# Patient Record
Sex: Male | Born: 1990 | Race: Black or African American | Hispanic: No | Marital: Married | Smoking: Never smoker
Health system: Southern US, Community
[De-identification: ages and names within clinical notes are randomized; demographics above are authoritative.]

## PROBLEM LIST (undated history)

## (undated) DIAGNOSIS — I10 Essential (primary) hypertension: Secondary | ICD-10-CM

---

## 2019-04-04 ENCOUNTER — Ambulatory Visit (INDEPENDENT_AMBULATORY_CARE_PROVIDER_SITE_OTHER): Payer: Self-pay

## 2019-04-04 ENCOUNTER — Other Ambulatory Visit: Payer: Self-pay

## 2019-04-04 ENCOUNTER — Ambulatory Visit (HOSPITAL_COMMUNITY)
Admission: EM | Admit: 2019-04-04 | Discharge: 2019-04-04 | Disposition: A | Discharge status: Home / Self Care | Payer: Self-pay | Attending: Family Medicine | Admitting: Family Medicine

## 2019-04-04 DIAGNOSIS — W228XXA Striking against or struck by other objects, initial encounter: Secondary | ICD-10-CM

## 2019-04-04 DIAGNOSIS — S62346A Nondisplaced fracture of base of fifth metacarpal bone, right hand, initial encounter for closed fracture: Secondary | ICD-10-CM

## 2019-04-04 MED ORDER — OXYCODONE HCL 5 MG PO CAPS: 5.0000 mg | ORAL_CAPSULE | Freq: Four times a day (QID) | ORAL | 0 refills | Status: AC | PRN

## 2019-04-04 NOTE — Discharge Instructions (Signed)
Ice/cold pack over area for 10-15 min twice daily.  OK to take Tylenol 1000 mg (2 extra strength tabs) or 975 mg (3 regular strength tabs) every 6 hours as needed.  Ibuprofen 400-600 mg (2-3 over the counter strength tabs) every 6 hours as needed for pain.  Do not drink alcohol, do any illicit/street drugs, drive or do anything that requires alertness while on this oxycodone.
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
## 2019-04-04 NOTE — ED Provider Notes (Signed)
  [facility]    CSN: [id number] Arrival date & time: 04/04/19  1749     History    Musculoskeletal Exam  Patient: Keith Pruitt DOB: November 01, 1991  DOS: 04/04/2019  SUBJECTIVE:  Chief Complaint:   Chief Complaint  Patient presents with  . Hand Injury    Keith Pruitt is a 28 y.o.  male for evaluation and treatment of R hand pain.   Onset:  1 hr ago. Fixing cabinets, pulled a knob and hand slipped and "punched" fridge.  Location: R hand Character:  aching and throbbing  Progression of issue:  is unchanged Associated symptoms: swelling Treatment: to date has been none.   Neurovascular symptoms: no  ROS: Musculoskeletal/Extremities: +R hand pain Neuro: No numbness or tingling  No known health problems  Objective: VITAL SIGNS: BP (!) 192/123 (BP Location: Right Arm)   Pulse 92   Temp 98 F (36.7 C) (Oral)   Resp 16   SpO2 98%  Constitutional: Well formed, well developed. No acute distress. Cardiovascular: Brisk cap refill Thorax & Lungs: No accessory muscle use Musculoskeletal: R hand.   ROM not severely affected Tenderness to palpation: yes, over lateral dorsum of R hand, base Deformity: gross edema noted on dorsum of hand Ecchymosis: yes on palmar side Neurologic: Normal sensory function. No focal deficits noted.  Psychiatric: Normal mood. Age appropriate judgment and insight. Alert & oriented x 3.     Final Clinical Impressions(s) / UC Diagnoses   Final diagnoses:  Closed nondisplaced fracture of base of fifth metacarpal bone of right hand, initial encounter   I applied an ulnar gutter splint to the RUE. Wrapped with ACE bandage. Refer to ortho. Short course of oxy for pain. Warnings for this given. Ice. Tylenol, NSAIDs.    Discharge Instructions     Ice/cold pack over area for 10-15 min twice daily.  OK to take Tylenol 1000 mg (2 extra strength tabs) or 975 mg (3 regular strength tabs) every 6 hours as needed.  Ibuprofen 400-600 mg (2-3  over the counter strength tabs) every 6 hours as needed for pain.  Do not drink alcohol, do any illicit/street drugs, drive or do anything that requires alertness while on this oxycodone.      ED Prescriptions    Medication Sig Dispense Auth. Provider   oxycodone (OXY-IR) 5 MG capsule Take 1 capsule (5 mg total) by mouth every 6 (six) hours as needed for pain. 10 capsule Sharlene Dory, DO     Controlled Substance Prescriptions  Controlled Substance Registry consulted? Not Applicable   Sharlene Dory, [place] 04/04/19 1191

## 2019-06-04 ENCOUNTER — Inpatient Hospital Stay (HOSPITAL_COMMUNITY): Payer: No Typology Code available for payment source

## 2019-06-04 ENCOUNTER — Inpatient Hospital Stay (HOSPITAL_COMMUNITY): Payer: No Typology Code available for payment source | Admitting: Certified Registered Nurse Anesthetist

## 2019-06-04 ENCOUNTER — Emergency Department (HOSPITAL_COMMUNITY): Payer: No Typology Code available for payment source

## 2019-06-04 ENCOUNTER — Inpatient Hospital Stay (HOSPITAL_COMMUNITY)
Admission: EM | Admit: 2019-06-04 | Discharge: 2019-06-07 | DRG: 493 | Disposition: A | Discharge status: Home Health | Payer: No Typology Code available for payment source | Attending: Orthopedic Surgery | Admitting: Orthopedic Surgery

## 2019-06-04 ENCOUNTER — Encounter (HOSPITAL_COMMUNITY)
Admission: EM | Disposition: A | Discharge status: Home Health | Payer: Self-pay | Source: Home / Self Care | Attending: Orthopedic Surgery

## 2019-06-04 ENCOUNTER — Encounter (HOSPITAL_COMMUNITY): Payer: Self-pay | Admitting: *Deleted

## 2019-06-04 ENCOUNTER — Other Ambulatory Visit: Payer: Self-pay

## 2019-06-04 DIAGNOSIS — S81832A Puncture wound without foreign body, left lower leg, initial encounter: Secondary | ICD-10-CM

## 2019-06-04 DIAGNOSIS — W3400XA Accidental discharge from unspecified firearms or gun, initial encounter: Secondary | ICD-10-CM

## 2019-06-04 DIAGNOSIS — Z419 Encounter for procedure for purposes other than remedying health state, unspecified: Secondary | ICD-10-CM

## 2019-06-04 DIAGNOSIS — S82392B Other fracture of lower end of left tibia, initial encounter for open fracture type I or II: Secondary | ICD-10-CM | POA: Diagnosis present

## 2019-06-04 DIAGNOSIS — S82832B Other fracture of upper and lower end of left fibula, initial encounter for open fracture type I or II: Principal | ICD-10-CM | POA: Diagnosis present

## 2019-06-04 DIAGNOSIS — S71132A Puncture wound without foreign body, left thigh, initial encounter: Secondary | ICD-10-CM | POA: Diagnosis present

## 2019-06-04 DIAGNOSIS — S99912A Unspecified injury of left ankle, initial encounter: Secondary | ICD-10-CM

## 2019-06-04 DIAGNOSIS — Z1159 Encounter for screening for other viral diseases: Secondary | ICD-10-CM

## 2019-06-04 DIAGNOSIS — I10 Essential (primary) hypertension: Secondary | ICD-10-CM | POA: Diagnosis present

## 2019-06-04 DIAGNOSIS — E876 Hypokalemia: Secondary | ICD-10-CM

## 2019-06-04 DIAGNOSIS — S82302B Unspecified fracture of lower end of left tibia, initial encounter for open fracture type I or II: Secondary | ICD-10-CM | POA: Diagnosis present

## 2019-06-04 HISTORY — PX: IM NAILING TIBIA: SUR734

## 2019-06-04 HISTORY — DX: Essential (primary) hypertension: I10

## 2019-06-04 HISTORY — PX: TIBIA IM NAIL INSERTION: SHX2516

## 2019-06-04 LAB — CBC
HCT: 39.1 % (ref 39.0–52.0)
Hemoglobin: 12.2 g/dL — ABNORMAL LOW (ref 13.0–17.0)
MCH: 25.8 pg — ABNORMAL LOW (ref 26.0–34.0)
MCHC: 31.2 g/dL (ref 30.0–36.0)
MCV: 82.7 fL (ref 80.0–100.0)
Platelets: 289 10*3/uL (ref 150–400)
RBC: 4.73 MIL/uL (ref 4.22–5.81)
RDW: 13.6 % (ref 11.5–15.5)
WBC: 11.6 10*3/uL — ABNORMAL HIGH (ref 4.0–10.5)
nRBC: 0 % (ref 0.0–0.2)

## 2019-06-04 LAB — URINALYSIS, ROUTINE W REFLEX MICROSCOPIC
Bilirubin Urine: NEGATIVE
Glucose, UA: NEGATIVE mg/dL
Ketones, ur: NEGATIVE mg/dL
Leukocytes,Ua: NEGATIVE
Nitrite: NEGATIVE
Protein, ur: 30 mg/dL — AB
Specific Gravity, Urine: 1.029 (ref 1.005–1.030)
pH: 6 (ref 5.0–8.0)

## 2019-06-04 LAB — I-STAT CHEM 8, ED
BUN: 19 mg/dL (ref 6–20)
Calcium, Ion: 1.16 mmol/L (ref 1.15–1.40)
Chloride: 103 mmol/L (ref 98–111)
Creatinine, Ser: 1.4 mg/dL — ABNORMAL HIGH (ref 0.61–1.24)
Glucose, Bld: 148 mg/dL — ABNORMAL HIGH (ref 70–99)
HCT: 39 % (ref 39.0–52.0)
Hemoglobin: 13.3 g/dL (ref 13.0–17.0)
Potassium: 2.8 mmol/L — ABNORMAL LOW (ref 3.5–5.1)
Sodium: 141 mmol/L (ref 135–145)
TCO2: 26 mmol/L (ref 22–32)

## 2019-06-04 LAB — SAMPLE TO BLOOD BANK

## 2019-06-04 LAB — COMPREHENSIVE METABOLIC PANEL
ALT: 19 U/L (ref 0–44)
AST: 23 U/L (ref 15–41)
Albumin: 3.9 g/dL (ref 3.5–5.0)
Alkaline Phosphatase: 66 U/L (ref 38–126)
Anion gap: 12 (ref 5–15)
BUN: 17 mg/dL (ref 6–20)
CO2: 24 mmol/L (ref 22–32)
Calcium: 9.2 mg/dL (ref 8.9–10.3)
Chloride: 105 mmol/L (ref 98–111)
Creatinine, Ser: 1.47 mg/dL — ABNORMAL HIGH (ref 0.61–1.24)
GFR calc Af Amer: >60 mL/min (ref >60)
GFR calc non Af Amer: >60 mL/min (ref >60)
Glucose, Bld: 154 mg/dL — ABNORMAL HIGH (ref 70–99)
Potassium: 2.8 mmol/L — ABNORMAL LOW (ref 3.5–5.1)
Sodium: 141 mmol/L (ref 135–145)
Total Bilirubin: 0.5 mg/dL (ref 0.3–1.2)
Total Protein: 7.3 g/dL (ref 6.5–8.1)

## 2019-06-04 LAB — PROTIME-INR
INR: 1 (ref 0.8–1.2)
Prothrombin Time: 13.3 seconds (ref 11.4–15.2)

## 2019-06-04 LAB — MRSA PCR SCREENING: MRSA by PCR: NEGATIVE

## 2019-06-04 LAB — CDS SEROLOGY

## 2019-06-04 LAB — ETHANOL: Alcohol, Ethyl (B): <10 mg/dL (ref <10)

## 2019-06-04 LAB — SARS CORONAVIRUS 2 BY RT PCR (HOSPITAL ORDER, PERFORMED IN ~~LOC~~ HOSPITAL LAB): SARS Coronavirus 2: NEGATIVE

## 2019-06-04 LAB — LACTIC ACID, PLASMA: Lactic Acid, Venous: 2.2 mmol/L (ref 0.5–1.9)

## 2019-06-04 SURGERY — INSERTION, INTRAMEDULLARY ROD, TIBIA
Anesthesia: General | Site: Leg Lower | Laterality: Left

## 2019-06-04 MED ORDER — POTASSIUM CHLORIDE 2 MEQ/ML IV SOLN
INTRAVENOUS | Status: DC
  Administered 2019-06-04 – 2019-06-05 (×2): via INTRAVENOUS
  Filled 2019-06-04 (×4): qty 1000

## 2019-06-04 MED ORDER — SODIUM CHLORIDE 0.9 % IR SOLN
Status: DC | PRN
  Administered 2019-06-04: 3000 mL

## 2019-06-04 MED ORDER — MIDAZOLAM HCL 2 MG/2ML IJ SOLN
INTRAMUSCULAR | Status: AC
  Filled 2019-06-04: qty 2

## 2019-06-04 MED ORDER — METOCLOPRAMIDE HCL 5 MG PO TABS: 5.0000 mg | ORAL_TABLET | Freq: Three times a day (TID) | ORAL | Status: DC | PRN

## 2019-06-04 MED ORDER — LIDOCAINE 2% (20 MG/ML) 5 ML SYRINGE
INTRAMUSCULAR | Status: DC | PRN
  Administered 2019-06-04: 60 mg via INTRAVENOUS

## 2019-06-04 MED ORDER — ONDANSETRON HCL 4 MG/2ML IJ SOLN: 4.0000 mg | Freq: Once | INTRAMUSCULAR | Status: DC | PRN

## 2019-06-04 MED ORDER — STERILE WATER FOR IRRIGATION IR SOLN
Status: DC | PRN
  Administered 2019-06-04: 500 mL

## 2019-06-04 MED ORDER — OXYCODONE HCL 5 MG/5ML PO SOLN: 5.0000 mg | Freq: Once | ORAL | Status: DC | PRN

## 2019-06-04 MED ORDER — ASPIRIN EC 81 MG PO TBEC: 81.0000 mg | DELAYED_RELEASE_TABLET | Freq: Two times a day (BID) | ORAL | 0 refills | Status: AC

## 2019-06-04 MED ORDER — DOCUSATE SODIUM 100 MG PO CAPS: 100.0000 mg | ORAL_CAPSULE | Freq: Two times a day (BID) | ORAL | 0 refills | Status: AC

## 2019-06-04 MED ORDER — DEXMEDETOMIDINE HCL IN NACL 200 MCG/50ML IV SOLN
INTRAVENOUS | Status: AC
  Filled 2019-06-04: qty 50

## 2019-06-04 MED ORDER — OXYCODONE HCL 5 MG PO TABS
5.0000 mg | ORAL_TABLET | ORAL | Status: DC | PRN
  Administered 2019-06-04: 17:00:00 5 mg via ORAL
  Administered 2019-06-04 – 2019-06-07 (×11): 10 mg via ORAL
  Filled 2019-06-04 (×3): qty 2
  Filled 2019-06-04: qty 1
  Filled 2019-06-04 (×4): qty 2
  Filled 2019-06-04: qty 1
  Filled 2019-06-04 (×4): qty 2

## 2019-06-04 MED ORDER — FENTANYL CITRATE (PF) 100 MCG/2ML IJ SOLN
INTRAMUSCULAR | Status: AC
  Filled 2019-06-04: qty 2

## 2019-06-04 MED ORDER — HYDROMORPHONE HCL 1 MG/ML IJ SOLN
1.0000 mg | Freq: Once | INTRAMUSCULAR | Status: AC
  Administered 2019-06-04: 1 mg via INTRAVENOUS
  Filled 2019-06-04: qty 1

## 2019-06-04 MED ORDER — SUCCINYLCHOLINE CHLORIDE 200 MG/10ML IV SOSY
PREFILLED_SYRINGE | INTRAVENOUS | Status: DC | PRN
  Administered 2019-06-04: 200 mg via INTRAVENOUS

## 2019-06-04 MED ORDER — GABAPENTIN 300 MG PO CAPS
300.0000 mg | ORAL_CAPSULE | Freq: Three times a day (TID) | ORAL | Status: DC
  Administered 2019-06-04 – 2019-06-07 (×10): 300 mg via ORAL
  Filled 2019-06-04 (×10): qty 1

## 2019-06-04 MED ORDER — 0.9 % SODIUM CHLORIDE (POUR BTL) OPTIME
TOPICAL | Status: DC | PRN
  Administered 2019-06-04: 1000 mL

## 2019-06-04 MED ORDER — HYDROMORPHONE HCL 1 MG/ML IJ SOLN
1.0000 mg | INTRAMUSCULAR | Status: DC | PRN
  Administered 2019-06-04 – 2019-06-05 (×3): 1 mg via INTRAVENOUS
  Filled 2019-06-04 (×3): qty 1

## 2019-06-04 MED ORDER — PROPRANOLOL HCL 20 MG PO TABS
40.0000 mg | ORAL_TABLET | Freq: Two times a day (BID) | ORAL | Status: DC
  Administered 2019-06-04 – 2019-06-07 (×6): 40 mg via ORAL
  Filled 2019-06-04 (×6): qty 2

## 2019-06-04 MED ORDER — PROPOFOL 10 MG/ML IV BOLUS
INTRAVENOUS | Status: DC | PRN
  Administered 2019-06-04: 250 mg via INTRAVENOUS

## 2019-06-04 MED ORDER — METHOCARBAMOL 500 MG PO TABS
750.0000 mg | ORAL_TABLET | Freq: Four times a day (QID) | ORAL | Status: DC | PRN
  Administered 2019-06-05 – 2019-06-07 (×5): 750 mg via ORAL
  Filled 2019-06-04 (×5): qty 2

## 2019-06-04 MED ORDER — POLYETHYLENE GLYCOL 3350 17 G PO PACK: 17.0000 g | PACK | Freq: Every day | ORAL | Status: DC | PRN

## 2019-06-04 MED ORDER — CEFAZOLIN SODIUM-DEXTROSE 1-4 GM/50ML-% IV SOLN
1.0000 g | Freq: Once | INTRAVENOUS | Status: AC
  Administered 2019-06-04: 1 g via INTRAVENOUS
  Filled 2019-06-04: qty 50

## 2019-06-04 MED ORDER — FENTANYL CITRATE (PF) 100 MCG/2ML IJ SOLN
INTRAMUSCULAR | Status: DC | PRN
  Administered 2019-06-04: 50 ug via INTRAVENOUS
  Administered 2019-06-04: 200 ug via INTRAVENOUS
  Administered 2019-06-04 (×2): 50 ug via INTRAVENOUS

## 2019-06-04 MED ORDER — ONDANSETRON HCL 4 MG/2ML IJ SOLN
INTRAMUSCULAR | Status: AC
  Administered 2019-06-04: 04:00:00
  Filled 2019-06-04: qty 2

## 2019-06-04 MED ORDER — SODIUM CHLORIDE 0.9 % IV SOLN
INTRAVENOUS | Status: AC
  Administered 2019-06-04: 05:00:00 via INTRAVENOUS

## 2019-06-04 MED ORDER — ROCURONIUM BROMIDE 10 MG/ML (PF) SYRINGE
PREFILLED_SYRINGE | INTRAVENOUS | Status: DC | PRN
  Administered 2019-06-04: 20 mg via INTRAVENOUS
  Administered 2019-06-04: 60 mg via INTRAVENOUS
  Administered 2019-06-04: 20 mg via INTRAVENOUS

## 2019-06-04 MED ORDER — BUPIVACAINE HCL (PF) 0.25 % IJ SOLN
INTRAMUSCULAR | Status: AC
  Filled 2019-06-04: qty 30

## 2019-06-04 MED ORDER — ONDANSETRON HCL 4 MG/2ML IJ SOLN: 4.0000 mg | Freq: Three times a day (TID) | INTRAMUSCULAR | Status: DC | PRN

## 2019-06-04 MED ORDER — OXYCODONE HCL 5 MG PO TABS: 5.0000 mg | ORAL_TABLET | ORAL | 0 refills | Status: AC | PRN

## 2019-06-04 MED ORDER — ONDANSETRON HCL 4 MG PO TABS
4.0000 mg | ORAL_TABLET | Freq: Four times a day (QID) | ORAL | Status: DC | PRN
  Administered 2019-06-07: 4 mg via ORAL
  Filled 2019-06-04: qty 1

## 2019-06-04 MED ORDER — GABAPENTIN 300 MG PO CAPS: 300.0000 mg | ORAL_CAPSULE | Freq: Three times a day (TID) | ORAL | 0 refills | Status: AC

## 2019-06-04 MED ORDER — DEXMEDETOMIDINE HCL IN NACL 200 MCG/50ML IV SOLN
INTRAVENOUS | Status: DC | PRN
  Administered 2019-06-04 (×4): 20 ug via INTRAVENOUS

## 2019-06-04 MED ORDER — HYDROMORPHONE HCL 1 MG/ML IJ SOLN
1.0000 mg | INTRAMUSCULAR | Status: DC | PRN
  Administered 2019-06-04: 1 mg via INTRAVENOUS
  Filled 2019-06-04: qty 1

## 2019-06-04 MED ORDER — BISACODYL 5 MG PO TBEC: 5.0000 mg | DELAYED_RELEASE_TABLET | Freq: Every day | ORAL | Status: DC | PRN

## 2019-06-04 MED ORDER — ONDANSETRON HCL 4 MG/2ML IJ SOLN: 4.0000 mg | Freq: Four times a day (QID) | INTRAMUSCULAR | Status: DC | PRN

## 2019-06-04 MED ORDER — FENTANYL CITRATE (PF) 250 MCG/5ML IJ SOLN
INTRAMUSCULAR | Status: AC
  Filled 2019-06-04: qty 5

## 2019-06-04 MED ORDER — KETOROLAC TROMETHAMINE 15 MG/ML IJ SOLN
15.0000 mg | Freq: Four times a day (QID) | INTRAMUSCULAR | Status: AC
  Administered 2019-06-04 – 2019-06-05 (×3): 15 mg via INTRAVENOUS
  Filled 2019-06-04 (×4): qty 1

## 2019-06-04 MED ORDER — CEFAZOLIN SODIUM-DEXTROSE 2-4 GM/100ML-% IV SOLN
2.0000 g | Freq: Three times a day (TID) | INTRAVENOUS | Status: AC
  Administered 2019-06-04 – 2019-06-05 (×4): 2 g via INTRAVENOUS
  Filled 2019-06-04 (×4): qty 100

## 2019-06-04 MED ORDER — KETOROLAC TROMETHAMINE 15 MG/ML IJ SOLN
INTRAMUSCULAR | Status: AC
  Administered 2019-06-04: 15 mg
  Filled 2019-06-04: qty 1

## 2019-06-04 MED ORDER — METHOCARBAMOL 500 MG PO TABS: 500.0000 mg | ORAL_TABLET | Freq: Three times a day (TID) | ORAL | 0 refills | Status: AC | PRN

## 2019-06-04 MED ORDER — ACETAMINOPHEN 325 MG PO TABS
325.0000 mg | ORAL_TABLET | Freq: Four times a day (QID) | ORAL | Status: DC | PRN
  Administered 2019-06-06 (×2): 650 mg via ORAL
  Filled 2019-06-04 (×2): qty 2

## 2019-06-04 MED ORDER — MORPHINE SULFATE (PF) 4 MG/ML IV SOLN
4.0000 mg | Freq: Once | INTRAVENOUS | Status: AC
  Administered 2019-06-04: 4 mg via INTRAVENOUS

## 2019-06-04 MED ORDER — POTASSIUM CHLORIDE 10 MEQ/100ML IV SOLN
10.0000 meq | INTRAVENOUS | Status: AC
  Administered 2019-06-04 (×4): 10 meq via INTRAVENOUS
  Filled 2019-06-04 (×4): qty 100

## 2019-06-04 MED ORDER — MORPHINE SULFATE (PF) 4 MG/ML IV SOLN
INTRAVENOUS | Status: AC
  Filled 2019-06-04: qty 1

## 2019-06-04 MED ORDER — SODIUM CHLORIDE 0.9 % IV SOLN
INTRAVENOUS | Status: DC | PRN
  Administered 2019-06-04: 25 ug/min via INTRAVENOUS

## 2019-06-04 MED ORDER — LACTATED RINGERS IV SOLN
INTRAVENOUS | Status: DC | PRN
  Administered 2019-06-04 (×2): via INTRAVENOUS

## 2019-06-04 MED ORDER — ONDANSETRON HCL 4 MG/2ML IJ SOLN
INTRAMUSCULAR | Status: DC | PRN
  Administered 2019-06-04: 4 mg via INTRAVENOUS

## 2019-06-04 MED ORDER — ACETAMINOPHEN 500 MG PO TABS: 1000.0000 mg | ORAL_TABLET | Freq: Three times a day (TID) | ORAL | 0 refills | Status: AC

## 2019-06-04 MED ORDER — CEFAZOLIN SODIUM-DEXTROSE 2-3 GM-%(50ML) IV SOLR
INTRAVENOUS | Status: DC | PRN
  Administered 2019-06-04: 2 g via INTRAVENOUS

## 2019-06-04 MED ORDER — CELECOXIB 200 MG PO CAPS: 200.0000 mg | ORAL_CAPSULE | Freq: Two times a day (BID) | ORAL | 0 refills | Status: AC

## 2019-06-04 MED ORDER — METHOCARBAMOL 1000 MG/10ML IJ SOLN
500.0000 mg | Freq: Four times a day (QID) | INTRAVENOUS | Status: DC | PRN
  Filled 2019-06-04: qty 5

## 2019-06-04 MED ORDER — METOCLOPRAMIDE HCL 5 MG/ML IJ SOLN: 5.0000 mg | Freq: Three times a day (TID) | INTRAMUSCULAR | Status: DC | PRN

## 2019-06-04 MED ORDER — DOCUSATE SODIUM 100 MG PO CAPS
100.0000 mg | ORAL_CAPSULE | Freq: Two times a day (BID) | ORAL | Status: DC
  Administered 2019-06-04 – 2019-06-07 (×6): 100 mg via ORAL
  Filled 2019-06-04 (×6): qty 1

## 2019-06-04 MED ORDER — MORPHINE SULFATE (PF) 4 MG/ML IV SOLN
4.0000 mg | Freq: Once | INTRAVENOUS | Status: AC
  Administered 2019-06-04: 04:00:00 4 mg via INTRAVENOUS

## 2019-06-04 MED ORDER — MIDAZOLAM HCL 2 MG/2ML IJ SOLN
INTRAMUSCULAR | Status: DC | PRN
  Administered 2019-06-04: 2 mg via INTRAVENOUS

## 2019-06-04 MED ORDER — SUGAMMADEX SODIUM 200 MG/2ML IV SOLN
INTRAVENOUS | Status: DC | PRN
  Administered 2019-06-04: 300 mg via INTRAVENOUS

## 2019-06-04 MED ORDER — OXYCODONE HCL 5 MG PO TABS: 5.0000 mg | ORAL_TABLET | Freq: Once | ORAL | Status: DC | PRN

## 2019-06-04 MED ORDER — FENTANYL CITRATE (PF) 100 MCG/2ML IJ SOLN: 25.0000 ug | INTRAMUSCULAR | Status: DC | PRN

## 2019-06-04 MED ORDER — BUPIVACAINE HCL (PF) 0.25 % IJ SOLN
INTRAMUSCULAR | Status: DC | PRN
  Administered 2019-06-04: 30 mL

## 2019-06-04 MED ORDER — PROPOFOL 10 MG/ML IV BOLUS
INTRAVENOUS | Status: AC
  Filled 2019-06-04: qty 20

## 2019-06-04 MED ORDER — SUGAMMADEX SODIUM 500 MG/5ML IV SOLN
INTRAVENOUS | Status: AC
  Filled 2019-06-04: qty 5

## 2019-06-04 MED ORDER — ONDANSETRON HCL 4 MG PO TABS: 4.0000 mg | ORAL_TABLET | Freq: Three times a day (TID) | ORAL | 0 refills | Status: AC | PRN

## 2019-06-04 MED ORDER — SODIUM CHLORIDE 0.9 % IV BOLUS
1000.0000 mL | Freq: Once | INTRAVENOUS | Status: AC
  Administered 2019-06-04: 1000 mL via INTRAVENOUS

## 2019-06-04 MED ORDER — IOHEXOL 350 MG/ML SOLN
125.0000 mL | Freq: Once | INTRAVENOUS | Status: AC | PRN
  Administered 2019-06-04: 04:00:00 125 mL via INTRAVENOUS

## 2019-06-04 MED ORDER — DIPHENHYDRAMINE HCL 12.5 MG/5ML PO ELIX: 12.5000 mg | ORAL_SOLUTION | ORAL | Status: DC | PRN

## 2019-06-04 MED ORDER — DEXAMETHASONE SODIUM PHOSPHATE 10 MG/ML IJ SOLN
INTRAMUSCULAR | Status: DC | PRN
  Administered 2019-06-04: 10 mg via INTRAVENOUS

## 2019-06-04 MED ORDER — ACETAMINOPHEN 500 MG PO TABS
1000.0000 mg | ORAL_TABLET | Freq: Three times a day (TID) | ORAL | Status: AC
  Administered 2019-06-05 (×2): 1000 mg via ORAL
  Filled 2019-06-04 (×3): qty 2

## 2019-06-04 MED ORDER — ONDANSETRON HCL 4 MG/2ML IJ SOLN
4.0000 mg | Freq: Once | INTRAMUSCULAR | Status: AC
  Administered 2019-06-04: 04:00:00 4 mg via INTRAVENOUS

## 2019-06-04 MED ORDER — ENOXAPARIN SODIUM 40 MG/0.4ML ~~LOC~~ SOLN
40.0000 mg | SUBCUTANEOUS | Status: DC
  Administered 2019-06-05 – 2019-06-07 (×3): 40 mg via SUBCUTANEOUS
  Filled 2019-06-04 (×3): qty 0.4

## 2019-06-04 SURGICAL SUPPLY — 77 items
BANDAGE ACE 6X5 VEL STRL LF (GAUZE/BANDAGES/DRESSINGS) IMPLANT
BIT DRILL FREE HAND 4.2X130 (DRILL) ×1 IMPLANT
BIT DRILL LOCK 4.2X360 (DRILL) ×1 IMPLANT
BLADE SURG 10 STRL SS (BLADE) ×3 IMPLANT
BNDG COHESIVE 4X5 TAN STRL (GAUZE/BANDAGES/DRESSINGS) IMPLANT
BNDG ELASTIC 4X5.8 VLCR STR LF (GAUZE/BANDAGES/DRESSINGS) IMPLANT
BNDG ELASTIC 6X15 VLCR STRL LF (GAUZE/BANDAGES/DRESSINGS) ×3 IMPLANT
BNDG GAUZE ELAST 4 BULKY (GAUZE/BANDAGES/DRESSINGS) IMPLANT
CHLORAPREP W/TINT 26 (MISCELLANEOUS) IMPLANT
COVER MAYO STAND STRL (DRAPES) ×3 IMPLANT
COVER SURGICAL LIGHT HANDLE (MISCELLANEOUS) ×3 IMPLANT
COVER WAND RF STERILE (DRAPES) IMPLANT
CUFF TOURN SGL QUICK 34 (TOURNIQUET CUFF)
CUFF TOURN SGL QUICK 42 (TOURNIQUET CUFF) IMPLANT
CUFF TRNQT CYL 34X4.125X (TOURNIQUET CUFF) IMPLANT
DRAPE C-ARM 42X72 X-RAY (DRAPES) ×3 IMPLANT
DRAPE C-ARMOR (DRAPES) ×3 IMPLANT
DRAPE HALF SHEET 40X57 (DRAPES) ×3 IMPLANT
DRAPE IMP U-DRAPE 54X76 (DRAPES) ×3 IMPLANT
DRESSING OPSITE X SMALL 2X3 (GAUZE/BANDAGES/DRESSINGS) IMPLANT
DRILL FREE HAND 4.2X130 (DRILL) ×3
DRILL LOCK 4.2X360 (DRILL) ×3
DRSG ADAPTIC 3X8 NADH LF (GAUZE/BANDAGES/DRESSINGS) ×3 IMPLANT
DRSG TEGADERM 4X4.75 (GAUZE/BANDAGES/DRESSINGS) IMPLANT
ELECT CAUTERY BLADE 6.4 (BLADE) ×3 IMPLANT
ELECT REM PT RETURN 9FT ADLT (ELECTROSURGICAL) ×3
ELECTRODE REM PT RTRN 9FT ADLT (ELECTROSURGICAL) ×1 IMPLANT
GAUZE SPONGE 4X4 12PLY STRL (GAUZE/BANDAGES/DRESSINGS) IMPLANT
GAUZE SPONGE 4X4 12PLY STRL LF (GAUZE/BANDAGES/DRESSINGS) ×3 IMPLANT
GLOVE BIO SURGEON STRL SZ 6.5 (GLOVE) ×2 IMPLANT
GLOVE BIO SURGEON STRL SZ7.5 (GLOVE) ×6 IMPLANT
GLOVE BIO SURGEONS STRL SZ 6.5 (GLOVE) ×1
GLOVE BIOGEL PI IND STRL 8 (GLOVE) ×6 IMPLANT
GLOVE BIOGEL PI INDICATOR 8 (GLOVE) ×12
GOWN STRL REUS W/ TWL LRG LVL3 (GOWN DISPOSABLE) ×3 IMPLANT
GOWN STRL REUS W/TWL LRG LVL3 (GOWN DISPOSABLE) ×6
GUIDEROD T2 3X1000 (ROD) ×3 IMPLANT
K-WIRE 3X285 (WIRE) ×6
K-WIRE FIXATION 3X285 COATED (WIRE) ×6
KIT BASIN OR (CUSTOM PROCEDURE TRAY) ×3 IMPLANT
KIT TURNOVER KIT B (KITS) ×3 IMPLANT
KWIRE 3X285 (WIRE) ×2 IMPLANT
KWIRE FIXATION 3X285 COATED (WIRE) ×2 IMPLANT
MANIFOLD NEPTUNE II (INSTRUMENTS) ×3 IMPLANT
NAIL ELAS INSERT SLV SPI 8-11 (MISCELLANEOUS) ×3 IMPLANT
NAIL TIBIAL T2 9X390 (Nail) ×3 IMPLANT
NEEDLE HYPO 25GX1X1/2 BEV (NEEDLE) ×3 IMPLANT
NS IRRIG 1000ML POUR BTL (IV SOLUTION) ×3 IMPLANT
PACK ORTHO EXTREMITY (CUSTOM PROCEDURE TRAY) ×3 IMPLANT
PACK UNIVERSAL I (CUSTOM PROCEDURE TRAY) ×3 IMPLANT
PAD ABD 8X10 STRL (GAUZE/BANDAGES/DRESSINGS) ×6 IMPLANT
PAD ARMBOARD 7.5X6 YLW CONV (MISCELLANEOUS) ×3 IMPLANT
PAD CAST 4YDX4 CTTN HI CHSV (CAST SUPPLIES) ×1 IMPLANT
PADDING CAST COTTON 4X4 STRL (CAST SUPPLIES) ×2
PADDING CAST COTTON 6X4 STRL (CAST SUPPLIES) ×3 IMPLANT
REAMER INTRAMEDULLARY 8MM 510 (MISCELLANEOUS) ×3 IMPLANT
SCREW LOCK T2 5X42.5 (Screw) ×6 IMPLANT
SCREW LOCK T2 5X47.5 (Screw) ×3 IMPLANT
SCREW LOCK T2 5X52.5 (Screw) ×3 IMPLANT
SPONGE LAP 18X18 RF (DISPOSABLE) ×6 IMPLANT
STAPLER VISISTAT 35W (STAPLE) IMPLANT
SUT ETHILON 2 0 PSLX (SUTURE) ×3 IMPLANT
SUT ETHILON 3 0 PS 1 (SUTURE) ×6 IMPLANT
SUT MON AB 2-0 CT1 27 (SUTURE) IMPLANT
SUT VIC AB 0 CT1 27 (SUTURE) ×2
SUT VIC AB 0 CT1 27XBRD ANBCTR (SUTURE) ×1 IMPLANT
SUT VIC AB 2-0 CT1 27 (SUTURE) ×4
SUT VIC AB 2-0 CT1 TAPERPNT 27 (SUTURE) ×2 IMPLANT
SYR BULB IRRIGATION 50ML (SYRINGE) ×3 IMPLANT
SYR CONTROL 10ML LL (SYRINGE) ×3 IMPLANT
TOWEL GREEN STERILE (TOWEL DISPOSABLE) ×3 IMPLANT
TOWEL GREEN STERILE FF (TOWEL DISPOSABLE) ×3 IMPLANT
TOWEL OR NON WOVEN STRL DISP B (DISPOSABLE) ×3 IMPLANT
TUBE CONNECTING 12'X1/4 (SUCTIONS) ×1
TUBE CONNECTING 12X1/4 (SUCTIONS) ×2 IMPLANT
WATER STERILE IRR 1000ML POUR (IV SOLUTION) ×3 IMPLANT
YANKAUER SUCT BULB TIP NO VENT (SUCTIONS) ×3 IMPLANT

## 2019-06-04 NOTE — Progress Notes (Signed)
Patient admitted to unit. A/O x 4. Placed on telemetry.Will continue to monitor.

## 2019-06-04 NOTE — Anesthesia Procedure Notes (Signed)
Procedure Name: Intubation Date/Time: 06/04/2019 11:30 AM Performed by: Leonor Liv, CRNA Pre-anesthesia Checklist: Patient identified, Emergency Drugs available, Suction available and Patient being monitored Patient Re-evaluated:Patient Re-evaluated prior to induction Oxygen Delivery Method: Circle System Utilized Preoxygenation: Pre-oxygenation with 100% oxygen Induction Type: IV induction Ventilation: Mask ventilation without difficulty Laryngoscope Size: Mac and 4 Grade View: Grade I Tube type: Oral Tube size: 7.5 mm Number of attempts: 1 Airway Equipment and Method: Stylet and Oral airway Placement Confirmation: ETT inserted through vocal cords under direct vision,  positive ETCO2 and breath sounds checked- equal and bilateral Secured at: 23 cm Tube secured with: Tape Dental Injury: Teeth and Oropharynx as per pre-operative assessment
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
## 2019-06-04 NOTE — Anesthesia Preprocedure Evaluation (Signed)
Anesthesia Evaluation  Patient identified by MRN, date of birth, ID band Patient awake    Reviewed: Allergy & Precautions, NPO status , Patient's Chart, lab work & pertinent test results  Airway Mallampati: III  TM Distance: >3 FB Neck ROM: Full    Dental  (+) Teeth Intact, Dental Advisory Given   Pulmonary    breath sounds clear to auscultation       Cardiovascular hypertension,  Rhythm:Regular Rate:Normal     Neuro/Psych    GI/Hepatic   Endo/Other    Renal/GU      Musculoskeletal   Abdominal   Peds  Hematology   Anesthesia Other Findings   Reproductive/Obstetrics                             Anesthesia Physical Anesthesia Plan  ASA: III  Anesthesia Plan: General   Post-op Pain Management:    Induction: Intravenous  PONV Risk Score and Plan: Dexamethasone and Ondansetron  Airway Management Planned: Oral ETT  Additional Equipment:   Intra-op Plan:   Post-operative Plan: Extubation in OR  Informed Consent: I have reviewed the patients History and Physical, chart, labs and discussed the procedure including the risks, benefits and alternatives for the proposed anesthesia with the patient or authorized representative who has indicated his/her understanding and acceptance.     Dental advisory given  Plan Discussed with: CRNA and Anesthesiologist  Anesthesia Plan Comments:         Anesthesia Quick Evaluation
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
## 2019-06-04 NOTE — Progress Notes (Signed)
Orthopedic Tech Progress Note Patient Details:  Keith Pruitt 1991-10-11 [id number]  Ortho Devices Type of Ortho Device: CAM walker Ortho Device/Splint Location: lle. applied with drs assistance Ortho Device/Splint Interventions: Ordered, Application, Adjustment   Post Interventions Patient Tolerated: Well Instructions Provided: Care of device, Adjustment of device   [place] Pink 06/04/2019, 1:17 PM

## 2019-06-04 NOTE — Plan of Care (Signed)
  Problem: Education: Goal: Knowledge of General Education information will improve Description Including pain rating scale, medication(s)/side effects and non-pharmacologic comfort measures Outcome: Progressing   

## 2019-06-04 NOTE — Progress Notes (Signed)
Second IV cancelled due to patient no longer needing different IV medications

## 2019-06-04 NOTE — Anesthesia Postprocedure Evaluation (Signed)
Anesthesia Post Note  Patient: Keith Pruitt  Procedure(s) Performed: INTRAMEDULLARY (IM) NAIL TIBIAL LEFT (Left Leg Lower)     Patient location during evaluation: PACU Anesthesia Type: General Level of consciousness: awake and alert Pain management: pain level controlled Vital Signs Assessment: post-procedure vital signs reviewed and stable Respiratory status: spontaneous breathing, nonlabored ventilation, respiratory function stable and patient connected to nasal cannula oxygen Cardiovascular status: blood pressure returned to baseline and stable Postop Assessment: no apparent nausea or vomiting Anesthetic complications: no    Last Vitals:  Vitals:   06/04/19 1414 06/04/19 1430  BP: 139/84 (!) 159/101  Pulse:  90  Resp:  17  Temp:  36.6 C  SpO2:  95%    Last Pain:  Vitals:   06/04/19 1430  TempSrc: Oral  PainSc: Asleep                 Gelila Well COKER
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
## 2019-06-04 NOTE — Progress Notes (Signed)
Spoke with Dr. Linna Caprice, patient requesting something for pain. Verbal order for dilaudid 1mg  given.

## 2019-06-04 NOTE — Transfer of Care (Signed)
Immediate Anesthesia Transfer of Care Note  Patient: Ramces Shomaker  Procedure(s) Performed: INTRAMEDULLARY (IM) NAIL TIBIAL LEFT (Left Leg Lower)  Patient Location: PACU  Anesthesia Type:General  Level of Consciousness: sedated  Airway & Oxygen Therapy: Patient Spontanous Breathing and Patient connected to face mask oxygen  Post-op Assessment: Report given to RN and Post -op Vital signs reviewed and stable  Post vital signs: Reviewed and stable  Last Vitals:  Vitals Value Taken Time  BP 110/53 06/04/19 1344  Temp    Pulse 82 06/04/19 1345  Resp 18 06/04/19 1345  SpO2 94 % 06/04/19 1345  Vitals shown include unvalidated device data.  Last Pain:  Vitals:   06/04/19 1020  TempSrc:   PainSc: 10-Worst pain ever         Complications: No apparent anesthesia complications

## 2019-06-04 NOTE — Consult Note (Signed)
I have reviewed xrays Tentative plan is for IM nail of the tibia this morning at 11am.   I will discuss with vascular service.  Formal c/s to follow   Keith Pruitt

## 2019-06-04 NOTE — Plan of Care (Signed)
  Problem: Clinical Measurements: Goal: Ability to maintain clinical measurements within normal limits will improve Outcome: Progressing   Problem: Coping: Goal: Level of anxiety will decrease Outcome: Progressing   Problem: Pain Managment: Goal: General experience of comfort will improve Outcome: Progressing   

## 2019-06-04 NOTE — Interval H&P Note (Signed)
I participated in the care of this patient and agree with the above history, physical and evaluation. I performed a review of the history and a physical exam as detailed   Carole Binning MD   Medial foot is insensate Foot is [facility]
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
## 2019-06-04 NOTE — Progress Notes (Signed)
Orthopedic Tech Progress Note Patient Details:  Keith Pruitt [birth year] [id number]  Ortho Devices Type of Ortho Device: Post (short leg) splint, Stirrup splint Ortho Device/Splint Location: lle. applied with drs assistance Ortho Device/Splint Interventions: Ordered, Application, Adjustment   Post Interventions Patient Tolerated: Well Instructions Provided: Care of device, Adjustment of device   Karolee Stamps 06/04/2019, 4:50 AM

## 2019-06-05 ENCOUNTER — Other Ambulatory Visit: Payer: Self-pay

## 2019-06-05 ENCOUNTER — Encounter (HOSPITAL_COMMUNITY): Payer: Self-pay | Admitting: General Practice

## 2019-06-05 LAB — BASIC METABOLIC PANEL
Anion gap: 8 (ref 5–15)
BUN: 12 mg/dL (ref 6–20)
CO2: 24 mmol/L (ref 22–32)
Calcium: 8.6 mg/dL — ABNORMAL LOW (ref 8.9–10.3)
Chloride: 105 mmol/L (ref 98–111)
Creatinine, Ser: 1.02 mg/dL (ref 0.61–1.24)
GFR calc Af Amer: >60 mL/min (ref >60)
GFR calc non Af Amer: >60 mL/min (ref >60)
Glucose, Bld: 120 mg/dL — ABNORMAL HIGH (ref 70–99)
Potassium: 4 mmol/L (ref 3.5–5.1)
Sodium: 137 mmol/L (ref 135–145)
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
## 2019-06-05 NOTE — TOC Transition Note (Signed)
Transition of Care [facility]) - CM/SW Discharge Note   Patient Details  Name: Keith Pruitt MRN: [id number] Date of Birth: [birth year]  [facility] CM/SW Contact:  Claudie Leach, RN Phone Number: 06/05/2019, 11:40 AM   Clinical Narrative:    Discussed DC plan with patient.  Patient states he must have RW and 3n1 when leaves hospital and cannot [facility] for [facility] to [facility]iver.  DME bariatric RW and 3n1 ordered from [employer] to be delivered to room today.   Final next level of care: Home/Self Care Barriers to Discharge: No Barriers Identified   Patient Goals and CMS Choice Patient states their goals for this hospitalization and ongoing recovery are:: to go home and be safe   Discharge Plan and Services       DME Arranged: 3-N-1, Walker rolling(Bariatric) DME Agency: [employer] Date DME Agency Contacted: 06/05/19 Time DME Agency Contacted: 0511 Representative spoke with at DME Agency: Thedore Mins
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
## 2019-06-06 DIAGNOSIS — S71132A Puncture wound without foreign body, left thigh, initial encounter: Secondary | ICD-10-CM | POA: Diagnosis present

## 2019-06-06 DIAGNOSIS — Z1159 Encounter for screening for other viral diseases: Secondary | ICD-10-CM | POA: Diagnosis not present

## 2019-06-06 DIAGNOSIS — I10 Essential (primary) hypertension: Secondary | ICD-10-CM | POA: Diagnosis present

## 2019-06-06 DIAGNOSIS — E876 Hypokalemia: Secondary | ICD-10-CM | POA: Diagnosis present

## 2019-06-06 DIAGNOSIS — S82832B Other fracture of upper and lower end of left fibula, initial encounter for open fracture type I or II: Secondary | ICD-10-CM | POA: Diagnosis present

## 2019-06-06 DIAGNOSIS — S82392B Other fracture of lower end of left tibia, initial encounter for open fracture type I or II: Secondary | ICD-10-CM | POA: Diagnosis present

## 2019-06-06 DIAGNOSIS — W3400XA Accidental discharge from unspecified firearms or gun, initial encounter: Secondary | ICD-10-CM | POA: Diagnosis not present

## 2019-06-06 MED ORDER — ACETAMINOPHEN 500 MG PO TABS: 1000.0000 mg | ORAL_TABLET | Freq: Three times a day (TID) | ORAL | Status: DC

## 2019-06-06 MED ORDER — ACETAMINOPHEN 500 MG PO TABS
1000.0000 mg | ORAL_TABLET | Freq: Three times a day (TID) | ORAL | Status: DC
  Administered 2019-06-06 – 2019-06-07 (×4): 1000 mg via ORAL
  Filled 2019-06-06 (×4): qty 2

## 2019-06-06 MED ORDER — CELECOXIB 200 MG PO CAPS
200.0000 mg | ORAL_CAPSULE | Freq: Two times a day (BID) | ORAL | Status: DC
  Administered 2019-06-06 – 2019-06-07 (×3): 200 mg via ORAL
  Filled 2019-06-06 (×3): qty 1

## 2019-06-06 NOTE — Plan of Care (Signed)
  Problem: Pain Managment: Goal: General experience of comfort will improve Outcome: Progressing   Problem: Elimination: Goal: Will not experience complications related to bowel motility Outcome: Progressing   
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
## 2019-06-06 NOTE — Care Management (Signed)
Order received for DME wheelchair.  Discussed with patient and mother and requested that PT add the necessary documentation for WC .  Because patient has insurance through [place], he will either need to pay cash for [facility] ($360-600) or wait for [facility] to [facility], which could be several days- 1 week at home.  Patient states he will see how he feels later in the day and if he feels like going home, will update me on plan for WC.
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
## 2019-06-06 NOTE — Plan of Care (Signed)
  Problem: Pain Managment: Goal: General experience of comfort will improve Outcome: Progressing   Problem: Safety: Goal: Ability to remain free from injury will improve Outcome: Progressing   Problem: Skin Integrity: Goal: Risk for impaired skin integrity will decrease Outcome: Progressing   
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
## 2019-06-07 NOTE — Care Management (Addendum)
Discussed DC DME with patient and mother.  In order for patient to get DME today, he has to pay out of pocket for DME because he has insurance through [place].  The WC and hospital bed will cost around $1500-$1800.  The patient does not want to do this and wants to try to obtain DME from [place] this week.  Patient and mother are aware that it could take days to weeks to get this DME.  The patient does not know VA case worker's information or VA MD information.  The patient will obtain this information and contact [place], [place] office, or ortho office to assist as needed in obtaining this equipment.  The patient plans to dc today.

## 2019-06-07 NOTE — Discharge Instructions (Signed)
It is very important to keep foot elevated above your heart as much as possible to reduce pain and swelling.  Do foot pumps frequently to reduce swelling and to keep blood flowing in your lower legs.  Take medication to prevent blood clots (DVT) as directed.    Pain: Take medicines as prescribed.   You have several different medicines that work in different ways. Tylenol is for pain and you should take this every 8 hours while you still need pain medicine for up to 2 weeks. Take Celebrex 2 times per day to reduce pain / inflammation Take Gabapentin up to 3 times per day for pain Oxycodone is a narcotic pain medicine.  Take 1 tablet for breakthrough pain up to every 4 hours if needed.  This medicine can be dehydrating / constipating.  Do not take this unless you have severe pain. If needed, you may increase breakthrough pain medication for the first few days post op to 2 tablets every 4 hours.  Stop breakthrough pain medication as soon as you are able.  Diet: As you were doing prior to hospitalization   Shower:  May shower but keep the wounds dry, use an occlusive plastic wrap, NO SOAKING IN TUB.  If the bandage gets wet, change with a clean dry gauze.   Dressing:  Keep dressings on and dry until follow up.  Activity:  Increase activity slowly as tolerated, but follow the weight bearing instructions below.  The rules on driving is that you can not be taking narcotics while you drive, and you must feel in control of the vehicle.    Weight Bearing:   As tolerated in walking boot  To prevent constipation: you may use a stool softener such as -  Colace (over the counter) 100 mg by mouth twice a day  Drink plenty of fluids (prune juice may be helpful) and high fiber foods Miralax (over the counter) for constipation as needed.    Itching:  If you experience itching with your medications, try taking only a single pain pill, or even half a pain pill at a time.  You can also use benadryl over the  counter for itching or also to help with sleep.   Precautions:  If you experience chest pain or shortness of breath - call 911 immediately for transfer to the hospital emergency department!!  If you develop a fever greater that 101 F, purulent drainage from wound, increased redness or drainage from wound, or calf pain -- Call the office at [phone]                                                 Follow- Up Appointment:  Please call for an appointment to be seen in 7 days in [facility] - [phone]

## 2019-06-07 NOTE — Progress Notes (Signed)
Orthopedic Tech Progress Note Patient Details:  Keith Pruitt August 09, 1991 [id number] RN PAGED requesting a new Cam walker boot for patient. velcro wouldn't stick  Ortho Devices Type of Ortho Device: CAM walker Ortho Device/Splint Location: LLE Ortho Device/Splint Interventions: Adjustment, Application, Ordered   Post Interventions Patient Tolerated: Well Instructions Provided: Care of device, Adjustment of device   [name] 06/07/2019, 4:29 PM

## 2019-06-08 ENCOUNTER — Encounter (HOSPITAL_COMMUNITY): Payer: Self-pay | Admitting: Orthopedic Surgery
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
## 2023-02-11 ENCOUNTER — Other Ambulatory Visit (HOSPITAL_BASED_OUTPATIENT_CLINIC_OR_DEPARTMENT_OTHER): Payer: Self-pay

## 2023-02-11 ENCOUNTER — Emergency Department (HOSPITAL_BASED_OUTPATIENT_CLINIC_OR_DEPARTMENT_OTHER): Payer: No Typology Code available for payment source

## 2023-02-11 ENCOUNTER — Encounter (HOSPITAL_BASED_OUTPATIENT_CLINIC_OR_DEPARTMENT_OTHER): Payer: Self-pay | Admitting: Emergency Medicine

## 2023-02-11 ENCOUNTER — Other Ambulatory Visit: Payer: Self-pay

## 2023-02-11 ENCOUNTER — Emergency Department (HOSPITAL_BASED_OUTPATIENT_CLINIC_OR_DEPARTMENT_OTHER)
Admission: EM | Admit: 2023-02-11 | Discharge: 2023-02-11 | Disposition: A | Discharge status: Home / Self Care | Payer: No Typology Code available for payment source | Attending: Emergency Medicine | Admitting: Emergency Medicine

## 2023-02-11 DIAGNOSIS — J189 Pneumonia, unspecified organism: Secondary | ICD-10-CM | POA: Diagnosis not present

## 2023-02-11 DIAGNOSIS — Z7982 Long term (current) use of aspirin: Secondary | ICD-10-CM | POA: Diagnosis not present

## 2023-02-11 DIAGNOSIS — R042 Hemoptysis: Secondary | ICD-10-CM | POA: Diagnosis present

## 2023-02-11 LAB — COMPREHENSIVE METABOLIC PANEL
ALT: 17 U/L (ref 0–44)
AST: 17 U/L (ref 15–41)
Albumin: 4.4 g/dL (ref 3.5–5.0)
Alkaline Phosphatase: 58 U/L (ref 38–126)
Anion gap: 5 (ref 5–15)
BUN: 16 mg/dL (ref 6–20)
CO2: 33 mmol/L — ABNORMAL HIGH (ref 22–32)
Calcium: 9.8 mg/dL (ref 8.9–10.3)
Chloride: 101 mmol/L (ref 98–111)
Creatinine, Ser: 1.21 mg/dL (ref 0.61–1.24)
GFR, Estimated: >60 mL/min (ref >60)
Glucose, Bld: 103 mg/dL — ABNORMAL HIGH (ref 70–99)
Potassium: 3.1 mmol/L — ABNORMAL LOW (ref 3.5–5.1)
Sodium: 139 mmol/L (ref 135–145)
Total Bilirubin: 0.6 mg/dL (ref 0.3–1.2)
Total Protein: 7.6 g/dL (ref 6.5–8.1)

## 2023-02-11 LAB — CBC WITH DIFFERENTIAL/PLATELET
Abs Immature Granulocytes: 0.07 10*3/uL (ref 0.00–0.07)
Basophils Absolute: 0.1 10*3/uL (ref 0.0–0.1)
Basophils Relative: 0 %
Eosinophils Absolute: 0.1 10*3/uL (ref 0.0–0.5)
Eosinophils Relative: 0 %
HCT: 43.5 % (ref 39.0–52.0)
Hemoglobin: 14 g/dL (ref 13.0–17.0)
Immature Granulocytes: 0 %
Lymphocytes Relative: 8 %
Lymphs Abs: 1.3 10*3/uL (ref 0.7–4.0)
MCH: 24.8 pg — ABNORMAL LOW (ref 26.0–34.0)
MCHC: 32.2 g/dL (ref 30.0–36.0)
MCV: 77.1 fL — ABNORMAL LOW (ref 80.0–100.0)
Monocytes Absolute: 0.9 10*3/uL (ref 0.1–1.0)
Monocytes Relative: 6 %
Neutro Abs: 13.3 10*3/uL — ABNORMAL HIGH (ref 1.7–7.7)
Neutrophils Relative %: 86 %
Platelets: 212 10*3/uL (ref 150–400)
RBC: 5.64 MIL/uL (ref 4.22–5.81)
RDW: 14.5 % (ref 11.5–15.5)
WBC: 15.7 10*3/uL — ABNORMAL HIGH (ref 4.0–10.5)
nRBC: 0 % (ref 0.0–0.2)

## 2023-02-11 MED ORDER — DIPHENHYDRAMINE HCL 50 MG/ML IJ SOLN
12.5000 mg | Freq: Once | INTRAMUSCULAR | Status: AC
  Administered 2023-02-11: 12.5 mg via INTRAVENOUS
  Filled 2023-02-11: qty 1

## 2023-02-11 MED ORDER — AMOXICILLIN-POT CLAVULANATE 875-125 MG PO TABS: 1.0000 | ORAL_TABLET | Freq: Two times a day (BID) | ORAL | 0 refills | Status: AC

## 2023-02-11 MED ORDER — DOXYCYCLINE HYCLATE 100 MG PO CAPS: 100.0000 mg | ORAL_CAPSULE | Freq: Two times a day (BID) | ORAL | 0 refills | Status: AC

## 2023-02-11 MED ORDER — AMOXICILLIN 500 MG PO CAPS
1000.0000 mg | ORAL_CAPSULE | Freq: Once | ORAL | Status: AC
  Administered 2023-02-11: 1000 mg via ORAL
  Filled 2023-02-11: qty 2

## 2023-02-11 MED ORDER — IOHEXOL 350 MG/ML SOLN
100.0000 mL | Freq: Once | INTRAVENOUS | Status: AC | PRN
  Administered 2023-02-11: 100 mL via INTRAVENOUS

## 2023-02-11 MED ORDER — PROCHLORPERAZINE EDISYLATE 10 MG/2ML IJ SOLN
10.0000 mg | Freq: Once | INTRAMUSCULAR | Status: AC
  Administered 2023-02-11: 10 mg via INTRAVENOUS
  Filled 2023-02-11: qty 2

## 2023-02-11 MED ORDER — SODIUM CHLORIDE 0.9 % IV BOLUS
1000.0000 mL | Freq: Once | INTRAVENOUS | Status: AC
  Administered 2023-02-11: 1000 mL via INTRAVENOUS

## 2023-02-11 MED ORDER — DOXYCYCLINE HYCLATE 100 MG PO TABS
100.0000 mg | ORAL_TABLET | Freq: Once | ORAL | Status: AC
  Administered 2023-02-11: 100 mg via ORAL
  Filled 2023-02-11: qty 1

## 2023-02-11 NOTE — ED Triage Notes (Signed)
Pt via pov from home with cough that began last night; states he began coughing up bright red blood approximately 1 hour later. Pt denies any hx of the same. Pt reports he also has a migraine, which he has had for 2 days. Pt alert & oriented, nad noted.
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
